# Patient Record
Sex: Male | Born: 1985 | Race: Black or African American | Hispanic: No | Marital: Married | Smoking: Current every day smoker
Health system: Southern US, Community
[De-identification: ages and names within clinical notes are randomized; demographics above are authoritative.]

## PROBLEM LIST (undated history)

## (undated) DIAGNOSIS — R0602 Shortness of breath: Secondary | ICD-10-CM

## (undated) DIAGNOSIS — F419 Anxiety disorder, unspecified: Secondary | ICD-10-CM

## (undated) DIAGNOSIS — F4310 Post-traumatic stress disorder, unspecified: Secondary | ICD-10-CM

## (undated) HISTORY — DX: Anxiety disorder, unspecified: F41.9

## (undated) HISTORY — DX: Post-traumatic stress disorder, unspecified: F43.10

## (undated) HISTORY — DX: Shortness of breath: R06.02

## (undated) HISTORY — PX: NO PAST SURGERIES: SHX2092

---

## 2015-08-17 ENCOUNTER — Encounter (HOSPITAL_COMMUNITY): Payer: Self-pay | Admitting: *Deleted

## 2015-08-17 ENCOUNTER — Emergency Department (HOSPITAL_COMMUNITY)
Admission: EM | Admit: 2015-08-17 | Discharge: 2015-08-17 | Disposition: A | Discharge status: Home / Self Care | Payer: Self-pay | Attending: Emergency Medicine | Admitting: Emergency Medicine

## 2015-08-17 ENCOUNTER — Emergency Department (HOSPITAL_COMMUNITY): Payer: Self-pay

## 2015-08-17 DIAGNOSIS — J45909 Unspecified asthma, uncomplicated: Secondary | ICD-10-CM | POA: Insufficient documentation

## 2015-08-17 DIAGNOSIS — F419 Anxiety disorder, unspecified: Secondary | ICD-10-CM

## 2015-08-17 DIAGNOSIS — R0602 Shortness of breath: Secondary | ICD-10-CM

## 2015-08-17 DIAGNOSIS — R202 Paresthesia of skin: Secondary | ICD-10-CM | POA: Insufficient documentation

## 2015-08-17 DIAGNOSIS — Z880 Allergy status to penicillin: Secondary | ICD-10-CM | POA: Insufficient documentation

## 2015-08-17 DIAGNOSIS — F17210 Nicotine dependence, cigarettes, uncomplicated: Secondary | ICD-10-CM | POA: Insufficient documentation

## 2015-08-17 DIAGNOSIS — R42 Dizziness and giddiness: Secondary | ICD-10-CM | POA: Insufficient documentation

## 2015-08-17 LAB — BASIC METABOLIC PANEL
ANION GAP: 12 (ref 5–15)
BUN: 6 mg/dL (ref 6–20)
CALCIUM: 9.6 mg/dL (ref 8.9–10.3)
CO2: 23 mmol/L (ref 22–32)
CREATININE: 0.79 mg/dL (ref 0.61–1.24)
Chloride: 105 mmol/L (ref 101–111)
GFR calc Af Amer: >60 mL/min (ref >60)
GLUCOSE: 94 mg/dL (ref 65–99)
Potassium: 3 mmol/L — ABNORMAL LOW (ref 3.5–5.1)
Sodium: 140 mmol/L (ref 135–145)

## 2015-08-17 LAB — CBC WITH DIFFERENTIAL/PLATELET
BASOS ABS: 0 10*3/uL (ref 0.0–0.1)
Basophils Relative: 0 %
EOS ABS: 0.2 10*3/uL (ref 0.0–0.7)
EOS PCT: 2 %
HCT: 37.9 % — ABNORMAL LOW (ref 39.0–52.0)
Hemoglobin: 13.2 g/dL (ref 13.0–17.0)
LYMPHS PCT: 40 %
Lymphs Abs: 3.2 10*3/uL (ref 0.7–4.0)
MCH: 30.3 pg (ref 26.0–34.0)
MCHC: 34.8 g/dL (ref 30.0–36.0)
MCV: 87.1 fL (ref 78.0–100.0)
MONO ABS: 0.4 10*3/uL (ref 0.1–1.0)
Monocytes Relative: 5 %
Neutro Abs: 4.3 10*3/uL (ref 1.7–7.7)
Neutrophils Relative %: 53 %
PLATELETS: 197 10*3/uL (ref 150–400)
RBC: 4.35 MIL/uL (ref 4.22–5.81)
RDW: 11.8 % (ref 11.5–15.5)
WBC: 8.2 10*3/uL (ref 4.0–10.5)

## 2015-08-17 LAB — I-STAT TROPONIN, ED: TROPONIN I, POC: 0 ng/mL (ref 0.00–0.08)

## 2015-08-17 LAB — D-DIMER, QUANTITATIVE (NOT AT ARMC)

## 2015-08-17 MED ORDER — LORAZEPAM 1 MG PO TABS
1.0000 mg | ORAL_TABLET | Freq: Once | ORAL | Status: AC
  Administered 2015-08-17: 1 mg via ORAL
  Filled 2015-08-17: qty 1

## 2015-08-17 NOTE — ED Notes (Signed)
Pt states he had a panic attack earlier which caused him to have shortness of breath. He went to urgent care and did an EKG, prescribed pt xanax and citalopram. States they did not fill the scripts. Pt states he still feels chest pain and lightheadedness.

## 2015-08-17 NOTE — ED Notes (Signed)
Patient verbalized understanding of discharge instructions and denies any further needs or questions at this time. VS stable. Patient ambulatory with steady gait.  

## 2015-08-17 NOTE — ED Notes (Signed)
Patient transported to x-ray. ?

## 2015-08-17 NOTE — ED Provider Notes (Signed)
CSN: [id number]     Arrival date & time 08/17/15  0132 History  By signing my name below, I, Doreatha Martin, attest that this documentation has been prepared under the direction and in the presence of Shon Baton, MD. Electronically Signed: Doreatha Martin, ED Scribe. 08/17/2015. 2:16 AM.    Chief Complaint  Patient presents with  . Shortness of Breath    The history is provided by the patient. No language interpreter was used.   HPI Comments: Darrell Riley is a 30 y.o. male otherwise healthy who presents to the Emergency Department complaining of moderate, exertional SOB onset while sitting at work at 5 PM with associated orthostatic dizziness, chest "warmth", paresthesias to bilateral extremities. He denies recent increased stress. Pt was seen at [facility] for the same symptoms earlier today and received rx for Xanax and Citalopram, which he did not have filled. Pt states he had EKG with no acute findings at urgent care but does report that "my HR was fast." He states that he went home and was feeling well when he began to feel SOB again. He states h/o one episode of similar symptoms, but not as severe. Pt attributes these symptoms to a panic attack, but states he has not been formally diagnosed or worked up. No recent hospitalization, long travel, recent surgery, h/o asthma. He denies recent cough, leg swelling, fever, congestion.   History reviewed. No pertinent past medical history. History reviewed. No pertinent past surgical history. No family history on file. Social History  Substance Use Topics  . Smoking status: Current Every Day Smoker -- 0.50 packs/day    Types: Cigarettes  . Smokeless tobacco: None  . Alcohol Use: Yes     Comment: occ    Review of Systems  Constitutional: Negative for fever.  HENT: Negative for congestion.   Respiratory: Positive for shortness of breath. Negative for cough.   Cardiovascular: Negative for leg swelling.  Neurological: Positive for dizziness.  All  other systems reviewed and are negative.  Allergies  Amoxicillin  Home Medications   Prior to Admission medications   Not on File   BP 113/74 mmHg  Pulse 61  Temp(Src) 97.7 F (36.5 C) (Oral)  Resp 14  SpO2 99% Physical Exam  Constitutional: He is oriented to person, place, and time. He appears well-developed and well-nourished. No distress.  HENT:  Head: Normocephalic and atraumatic.  Eyes: Pupils are equal, round, and reactive to light.  Cardiovascular: Normal rate, regular rhythm and normal heart sounds.   No murmur heard. Pulmonary/Chest: Effort normal and breath sounds normal. No respiratory distress. He has no wheezes.  Abdominal: Soft. There is no tenderness.  Musculoskeletal: He exhibits no edema.  Neurological: He is alert and oriented to person, place, and time.  Skin: Skin is warm and dry.  Psychiatric: He has a normal mood and affect.  Nursing note and vitals reviewed.   ED Course  Procedures (including critical care time) DIAGNOSTIC STUDIES: Oxygen Saturation is 100% on RA, normal by my interpretation.    COORDINATION OF CARE: 2:15 AM Discussed treatment plan with pt at bedside and pt agreed to plan.   Labs Review Labs Reviewed  CBC WITH DIFFERENTIAL/PLATELET - Abnormal; Notable for the following:    HCT 37.9 (*)    All other components within normal limits  BASIC METABOLIC PANEL - Abnormal; Notable for the following:    Potassium 3.0 (*)    All other components within normal limits  D-DIMER, QUANTITATIVE (NOT AT [facility])  I-STAT  TROPOININ, ED    Imaging Review Dg Chest 2 View  08/17/2015  CLINICAL DATA:  Acute onset of shortness of breath and anxiety. Initial encounter. EXAM: CHEST  2 VIEW COMPARISON:  None. FINDINGS: The lungs are well-aerated and clear. There is no evidence of focal opacification, pleural effusion or pneumothorax. The heart is normal in size; the mediastinal contour is within normal limits. No acute osseous abnormalities are seen.  IMPRESSION: No acute cardiopulmonary process seen. Electronically Signed   By: Roanna Raider M.D.   On: 08/17/2015 05:49   I have personally reviewed and evaluated these images and lab results as part of my medical decision-making.   EKG Interpretation   Date/Time:  Saturday August 17 2015 01:47:27 EST Ventricular Rate:  61 PR Interval:  150 QRS Duration: 94 QT Interval:  416 QTC Calculation: 418 R Axis:   68 Text Interpretation:  Normal sinus rhythm Minimal voltage criteria for  LVH, may be normal variant Borderline ECG NO prior for comparison  Confirmed by HORTON  MD, COURTNEY ([id number]) on 08/17/2015 1:57:56 AM      MDM   Final diagnoses:  SOB (shortness of breath)  Anxiety    Patient presents with shortness of breath. He relates this to anxiety. He is in no acute distress. Vital signs are reassuring. No recent upper respiratory symptoms or fever. Reports being seen at urgent care and prescribed Xanax. He is not taking any medications. No risk factors for ACS or PE. No evidence of arrhythmia on EKG.  Workup including troponin, d-dimer, chest x-ray all reassuring. Basic labwork obtained and shows mild hypokalemia but otherwise reassuring. Discussed the results with the patient. Given reported "high heart rate" will have patient follow-up with cardiology for evaluation and possible Holter monitoring.  After history, exam, and medical workup I feel the patient has been appropriately medically screened and is safe for discharge home. Pertinent diagnoses were discussed with the patient. Patient was given return precautions.  I personally performed the services described in this documentation, which was scribed in my presence. The recorded information has been reviewed and is accurate.   Shon Baton, MD 08/17/15 [phone]

## 2015-08-17 NOTE — Discharge Instructions (Signed)
Panic Attacks °Panic attacks are sudden, short-lived surges of severe anxiety, fear, or discomfort. They may occur for no reason when you are relaxed, when you are anxious, or when you are sleeping. Panic attacks may occur for a number of reasons:  °· Healthy people occasionally have panic attacks in extreme, life-threatening situations, such as war or natural disasters. Normal anxiety is a protective mechanism of the body that helps us react to danger (fight or flight response). °· Panic attacks are often seen with anxiety disorders, such as panic disorder, social anxiety disorder, generalized anxiety disorder, and phobias. Anxiety disorders cause excessive or uncontrollable anxiety. They may interfere with your relationships or other life activities. °· Panic attacks are sometimes seen with other mental illnesses, such as depression and posttraumatic stress disorder. °· Certain medical conditions, prescription medicines, and drugs of abuse can cause panic attacks. °SYMPTOMS  °Panic attacks start suddenly, peak within 20 minutes, and are accompanied by four or more of the following symptoms: °· Pounding heart or fast heart rate (palpitations). °· Sweating. °· Trembling or shaking. °· Shortness of breath or feeling smothered. °· Feeling choked. °· Chest pain or discomfort. °· Nausea or strange feeling in your stomach. °· Dizziness, light-headedness, or feeling like you will faint. °· Chills or hot flushes. °· Numbness or tingling in your lips or hands and feet. °· Feeling that things are not real or feeling that you are not yourself. °· Fear of losing control or going crazy. °· Fear of dying. °Some of these symptoms can mimic serious medical conditions. For example, you may think you are having a heart attack. Although panic attacks can be very scary, they are not life threatening. °DIAGNOSIS  °Panic attacks are diagnosed through an assessment by your health care provider. Your health care provider will ask  questions about your symptoms, such as where and when they occurred. Your health care provider will also ask about your medical history and use of alcohol and drugs, including prescription medicines. Your health care provider may order blood tests or other studies to rule out a serious medical condition. Your health care provider may refer you to a mental health professional for further evaluation. °TREATMENT  °· Most healthy people who have one or two panic attacks in an extreme, life-threatening situation will not require treatment. °· The treatment for panic attacks associated with anxiety disorders or other mental illness typically involves counseling with a mental health professional, medicine, or a combination of both. Your health care provider will help determine what treatment is best for you. °· Panic attacks due to physical illness usually go away with treatment of the illness. If prescription medicine is causing panic attacks, talk with your health care provider about stopping the medicine, decreasing the dose, or substituting another medicine. °· Panic attacks due to alcohol or drug abuse go away with abstinence. Some adults need professional help in order to stop drinking or using drugs. °HOME CARE INSTRUCTIONS  °· Take all medicines as directed by your health care provider.   °· Schedule and attend follow-up visits as directed by your health care provider. It is important to keep all your appointments. °SEEK MEDICAL CARE IF: °· You are not able to take your medicines as prescribed. °· Your symptoms do not improve or get worse. °SEEK IMMEDIATE MEDICAL CARE IF:  °· You experience panic attack symptoms that are different than your usual symptoms. °· You have serious thoughts about hurting yourself or others. °· You are taking medicine for panic attacks and   have a serious side effect. °MAKE SURE YOU: °· Understand these instructions. °· Will watch your condition. °· Will get help right away if you are not  doing well or get worse. °  °This information is not intended to replace advice given to you by your health care provider. Make sure you discuss any questions you have with your health care provider. °  °Document Released: 06/08/2005 Document Revised: 06/13/2013 Document Reviewed: 01/20/2013 °Elsevier Interactive Patient Education ©2016 Elsevier Inc[employer] ° °Shortness of Breath °Shortness of breath means you have trouble breathing. It could also mean that you have a medical problem. You should get immediate medical care for shortness of breath. °CAUSES  °· Not enough oxygen in the air such as with high altitudes or a smoke-filled room. °· Certain lung diseases, infections, or problems. °· Heart disease or conditions, such as angina or heart failure. °· Low red blood cells (anemia). °· Poor physical fitness, which can cause shortness of breath when you exercise. °· Chest or back injuries or stiffness. °· Being overweight. °· Smoking. °· Anxiety, which can make you feel like you are not getting enough air. °DIAGNOSIS  °Serious medical problems can often be found during your physical exam. Tests may also be done to determine why you are having shortness of breath. Tests may include: °· Chest X-rays. °· Lung function tests. °· Blood tests. °· An electrocardiogram (ECG). °· An ambulatory electrocardiogram. An ambulatory ECG records your heartbeat patterns over a 24-hour period. °· Exercise testing. °· A transthoracic echocardiogram (TTE). During echocardiography, sound waves are used to evaluate how blood flows through your heart. °· A transesophageal echocardiogram (TEE). °· Imaging scans. °Your health care provider may not be able to find a cause for your shortness of breath after your exam. In this case, it is important to have a follow-up exam with your health care provider as directed.  °TREATMENT  °Treatment for shortness of breath depends on the cause of your symptoms and can vary greatly. °HOME CARE INSTRUCTIONS   °· Do not smoke. Smoking is a common cause of shortness of breath. If you smoke, ask for help to quit. °· Avoid being around chemicals or things that may bother your breathing, such as paint fumes and dust. °· Rest as needed. Slowly resume your usual activities. °· If medicines were prescribed, take them as directed for the full length of time directed. This includes oxygen and any inhaled medicines. °· Keep all follow-up appointments as directed by your health care provider. °SEEK MEDICAL CARE IF:  °· Your condition does not improve in the time expected. °· You have a hard time doing your normal activities even with rest. °· You have any new symptoms. °SEEK IMMEDIATE MEDICAL CARE IF:  °· Your shortness of breath gets worse. °· You feel light-headed, faint, or develop a cough not controlled with medicines. °· You start coughing up blood. °· You have pain with breathing. °· You have chest pain or pain in your arms, shoulders, or abdomen. °· You have a fever. °· You are unable to walk up stairs or exercise the way you normally do. °MAKE SURE YOU: °· Understand these instructions. °· Will watch your condition. °· Will get help right away if you are not doing well or get worse. °  °This information is not intended to replace advice given to you by your health care provider. Make sure you discuss any questions you have with your health care provider. °  °Document Released: 03/03/2001 Document Revised: 06/13/2013   Document Reviewed: 08/24/2011 °Elsevier Interactive Patient Education ©2016 Elsevier Inc. ° °

## 2015-08-26 NOTE — Progress Notes (Signed)
  Cardiology Office Note:    Date:  08/26/2015   ID:  Darrell Riley, DOB 12-20-1985, MRN [id number]  PCP:  No PCP Per Patient  Cardiologist:  New -   Electrophysiologist:  n/a  Chief Complaint  Patient presents with  . Hospitalization Follow-up    ED visit 08/17/15 for Dyspnea    History of Present Illness:     Darrell Siasaron Zerkle is a 30 y.o. male with a hx of     Past Medical History  Diagnosis Date  . SOB (shortness of breath)   . Anxiety     Past Surgical History  Procedure Laterality Date  . No past surgeries      Current Medications: No outpatient prescriptions prior to visit.   No facility-administered medications prior to visit.     Allergies:   Amoxicillin   Social History   Social History  . Marital Status: Married    Spouse Name: N/A  . Number of Children: N/A  . Years of Education: N/A   Social History Main Topics  . Smoking status: Current Every Day Smoker -- 0.50 packs/day    Types: Cigarettes  . Smokeless tobacco: Not on file  . Alcohol Use: Yes     Comment: occ  . Drug Use: No  . Sexual Activity: Not on file   Other Topics Concern  . Not on file   Social History Narrative     Family History:  The patient's family history is not on file.   ROS:   Please see the history of present illness.    ROS All other systems reviewed and are negative.   Physical Exam:    VS:  There were no vitals taken for this visit.   GEN: Well nourished, well developed, in no acute distress HEENT: normal Neck: no JVD, no masses Cardiac: Normal S1/S2, RRR; no murmurs, rubs, or gallops, no edema;   carotid bruits,   Respiratory:  clear to auscultation bilaterally; no wheezing, rhonchi or rales GI: soft, nontender, nondistended, + BS MS: no deformity or atrophy Skin: warm and dry, no rash Neuro:  Bilateral strength equal, no focal deficits  Psych: Alert and oriented x 3, normal affect  Wt Readings from Last 3 Encounters:  No data found for Wt       Studies/Labs Reviewed:     EKG:  EKG is  ordered today.  The ekg ordered today demonstrates   Recent Labs: 08/17/2015: BUN 6; Creatinine, Ser 0.79; Hemoglobin 13.2; Platelets 197; Potassium 3.0*; Sodium 140   Recent Lipid Panel No results found for: CHOL, TRIG, HDL, CHOLHDL, VLDL, LDLCALC, LDLDIRECT  Additional studies/ records that were reviewed today include:      ASSESSMENT:     No diagnosis found.  PLAN:     In order of problems listed above:  1.    Medication Adjustments/Labs and Tests Ordered: Current medicines are reviewed at length with the patient today.  Concerns regarding medicines are outlined above.  Medication changes, Labs and Tests ordered today are outlined in the Patient Instructions noted below. There are no Patient Instructions on file for this visit. Signed, Tereso NewcomerScott Heath Badon, PA-C  08/26/2015 10:41 PM    [facility] [address], [place], [place]  [address] Phone: [phone]; Fax: [phone]     This encounter was created in error - please disregard.

## 2015-08-27 ENCOUNTER — Encounter: Payer: Self-pay | Admitting: Physician Assistant

## 2016-06-01 ENCOUNTER — Encounter: Payer: Self-pay | Admitting: Cardiology

## 2016-06-01 ENCOUNTER — Ambulatory Visit (INDEPENDENT_AMBULATORY_CARE_PROVIDER_SITE_OTHER): Payer: 59 | Admitting: Cardiology

## 2016-06-01 VITALS — BP 128/87 | HR 58 | Ht 69.0 in | Wt 132.0 lb

## 2016-06-01 DIAGNOSIS — R0602 Shortness of breath: Secondary | ICD-10-CM | POA: Diagnosis not present

## 2016-06-01 DIAGNOSIS — R002 Palpitations: Secondary | ICD-10-CM | POA: Diagnosis not present

## 2016-06-01 DIAGNOSIS — F419 Anxiety disorder, unspecified: Secondary | ICD-10-CM

## 2016-06-01 NOTE — Patient Instructions (Signed)
Medication Instructions: Your physician recommends that you continue on your current medications as directed. Please refer to the Current Medication list given to you today.   Labwork:  Your physician recommends that you return for lab work today: CBC, BMP, T4,free, TSH, Magnesium  Testing/Procedures: Your physician has recommended that you wear a 48 hr holter monitor. Holter monitors are medical devices that record the heart's electrical activity. Doctors most often use these monitors to diagnose arrhythmias. Arrhythmias are problems with the speed or rhythm of the heartbeat. The monitor is a small, portable device. You can wear one while you do your normal daily activities. This is usually used to diagnose what is causing palpitations/syncope (passing out).  Your physician has requested that you have an echocardiogram. Echocardiography is a painless test that uses sound waves to create images of your heart. It provides your doctor with information about the size and shape of your heart and how well your heart's chambers and valves are working. This procedure takes approximately one hour. There are no restrictions for this procedure.  *Both will be done at [address]*   Follow-Up: Your physician recommends that you schedule a follow-up appointment in: 4-6 weeks with Dr. SwazilandJordan.  If you need a refill on your cardiac medications before your next appointment, please call your pharmacy.

## 2016-06-01 NOTE — Progress Notes (Addendum)
Cardiology Office Note  NEW PATIENT VISIT   Date:  06/01/2016   ID:  Darrell Riley, DOB 12/15/1985, MRN [id number]  PCP:  No PCP Per Patient  Cardiologist:  New Dr. SwazilandJordan     Chief Complaint  Patient presents with  . Hospitalization Follow-up    pt states some tightness in his chest during his panic attacks has also been experiencing dizziness during his panic attacks   . Shortness of Breath    sometimes when trying to go to sleep he feels like he is losing his breath and sometimes it wakes him up and he goes into a pnic attack   . New Patient (Initial Visit)      History of Present Illness: Darrell Riley is a 30 y.o. male who presents for post hospital in 07/2015.  He felt he was doing well but now with more tachycardia and panic attacks.    [facility] with SOB and anxiety.  He had complained of high heart rate.  EKG with LVH though may be normal varrient.   Today he presents with more freq episodes.  They do begin as dizziness, SOB  This may awaken from sleep and he has to sit up.  If not controlled it becomes full panic attack.  He denies any but rare caffeine and no energy drinks.  He was on meds for his anxiety but when he had no more symptoms he stopped using and really did not have a problem for months.  He is now having episodes daily.  He does smoke and I have asked him to stop but with panic attacks this may be difficult.    In Feb his CXR was normal and DDimer was low.  His K+ was also low at 3.0.    No FH of heart disease both parents with HTN.      Past Medical History:  Diagnosis Date  . Anxiety   . SOB (shortness of breath)     Past Surgical History:  Procedure Laterality Date  . NO PAST SURGERIES       No current outpatient prescriptions on file.   No current facility-administered medications for this visit.     Allergies:   Amoxicillin    Social History:  The patient  reports that he has been smoking Cigarettes.  He has been  smoking about 0.50 packs per day. He has never used smokeless tobacco. He reports that he drinks alcohol. He reports that he does not use drugs.   Family History:  The patient's family history includes Hypertension in his father and mother.    ROS:  General:no colds or fevers, no weight changes Skin:no rashes or ulcers HEENT:no blurred vision, no congestion CV:see HPI PUL:see HPI GI:no diarrhea constipation or melena, no indigestion GU:no hematuria, no dysuria MS:no joint pain, no claudication Neuro:no syncope, no lightheadedness Endo:no diabetes, no thyroid disease  Wt Readings from Last 3 Encounters:  06/01/16 132 lb (59.9 kg)     PHYSICAL EXAM: VS:  BP 128/87   Pulse (!) 58   Ht 5\' 9"  (1.753 m)   Wt 132 lb (59.9 kg)   BMI 19.49 kg/m  , BMI Body mass index is 19.49 kg/m. General:Pleasant affect, NAD Skin:Warm and dry, brisk capillary refill HEENT:normocephalic, sclera clear, mucus membranes moist Neck:supple, no JVD, no bruits  Heart:S1S2 RRR without murmur, gallup, rub or click Lungs:clear without rales, rhonchi, or wheezes UUV:OZDGAbd:soft, non tender, + BS, do not palpate liver spleen  or masses Ext:no lower ext edema, 2+ pedal pulses, 2+ radial pulses Neuro:alert and oriented, MAE, follows commands, + facial symmetry    EKG:  EKG is ordered today. The ekg ordered today demonstrates Sinus Brady at 58, no acute changes from Feb.     Recent Labs: 08/17/2015: BUN 6; Creatinine, Ser 0.79; Hemoglobin 13.2; Platelets 197; Potassium 3.0; Sodium 140    Lipid Panel No results found for: CHOL, TRIG, HDL, CHOLHDL, VLDL, LDLCALC, LDLDIRECT     Other studies Reviewed: Additional studies/ records that were reviewed today include: ER notes labs and XRAYS.Marland Kitchen.   ASSESSMENT AND PLAN:  1. Palpitations with dizziness and SOB occurs before panic attacks.occurring daily will check 48 hour holter to eval.  Also Echo with palpitations and dyspnea.  Also check BMP, CBC , TSH, Free T4 and  mg+ level. Discussed with Dr. SwazilandJordan.    2.  SOB at rest.  Normal CXR he does smoke asked him to stop will check echo  3. Panic attacks- ok to see primary for treatment.  He will follow up with Dr. SwazilandJordan in 4-6 weeks though we will call him results of tests.    His wife was with him today for exam.   Current medicines are reviewed with the patient today.  The patient Has no concerns regarding medicines.  The following changes have been made:  See above Labs/ tests ordered today include:see above  Disposition:   FU:  see above  Signed, Nada BoozerLaura Ingold, NP  06/01/2016 10:43 AM    [facility] [address], [place], [place]  [address]/ [address] [place], [place] Phone: [phone](336) [phone]; Fax: [phone]  [phone]

## 2016-06-02 LAB — CBC WITH DIFFERENTIAL/PLATELET
Basophils Absolute: 0 10*3/uL (ref 0.0–0.2)
Basos: 0 %
EOS (ABSOLUTE): 0.2 10*3/uL (ref 0.0–0.4)
Eos: 3 %
Hematocrit: 41.7 % (ref 37.5–51.0)
Hemoglobin: 14.3 g/dL (ref 13.0–17.7)
Immature Grans (Abs): 0 10*3/uL (ref 0.0–0.1)
Immature Granulocytes: 0 %
Lymphocytes Absolute: 2.2 10*3/uL (ref 0.7–3.1)
Lymphs: 30 %
MCH: 31.1 pg (ref 26.6–33.0)
MCHC: 34.3 g/dL (ref 31.5–35.7)
MCV: 91 fL (ref 79–97)
Monocytes Absolute: 0.6 10*3/uL (ref 0.1–0.9)
Monocytes: 9 %
Neutrophils Absolute: 4.2 10*3/uL (ref 1.4–7.0)
Neutrophils: 58 %
Platelets: 223 10*3/uL (ref 150–379)
RBC: 4.6 x10E6/uL (ref 4.14–5.80)
RDW: 12.8 % (ref 12.3–15.4)
WBC: 7.2 10*3/uL (ref 3.4–10.8)

## 2016-06-02 LAB — BASIC METABOLIC PANEL
BUN/Creatinine Ratio: 8 — ABNORMAL LOW (ref 9–20)
BUN: 7 mg/dL (ref 6–20)
CO2: 23 mmol/L (ref 18–29)
Calcium: 9.3 mg/dL (ref 8.7–10.2)
Chloride: 102 mmol/L (ref 96–106)
Creatinine, Ser: 0.83 mg/dL (ref 0.76–1.27)
GFR calc Af Amer: 138 mL/min/{1.73_m2} (ref >59)
GFR calc non Af Amer: 120 mL/min/{1.73_m2} (ref >59)
Glucose: 88 mg/dL (ref 65–99)
Potassium: 3.6 mmol/L (ref 3.5–5.2)
Sodium: 142 mmol/L (ref 134–144)

## 2016-06-02 LAB — MAGNESIUM: Magnesium: 2 mg/dL (ref 1.6–2.3)

## 2016-06-02 LAB — T4, FREE: FREE T4: 1.15 ng/dL (ref 0.82–1.77)

## 2016-06-02 LAB — TSH: TSH: 2.13 u[IU]/mL (ref 0.450–4.500)

## 2016-06-08 ENCOUNTER — Encounter: Payer: Self-pay | Admitting: *Deleted

## 2016-06-08 NOTE — Progress Notes (Signed)
Patient ID: Darrell Riley, male   DOB: 31-Mar-1986, 30 y.o.   MRN: [id number]  Patient did not show up for 06/08/16, 12:00 PM, appointment to have a cardiac event monitor applied.

## 2016-06-08 NOTE — Progress Notes (Signed)
Patient ID: Darrell Riley, male   DOB: 1985-10-05, 30 y.o.   MRN: [id number]  Patient did not show up for 06/08/16, 2:30 PM, appointment to have a 48 hour holter monitor applied.

## 2016-06-10 ENCOUNTER — Other Ambulatory Visit (HOSPITAL_COMMUNITY): Payer: 59 | Attending: Psychiatry | Admitting: Psychiatry

## 2016-06-10 ENCOUNTER — Encounter (HOSPITAL_COMMUNITY): Payer: Self-pay

## 2016-06-10 DIAGNOSIS — F4310 Post-traumatic stress disorder, unspecified: Secondary | ICD-10-CM | POA: Insufficient documentation

## 2016-06-10 DIAGNOSIS — F4001 Agoraphobia with panic disorder: Secondary | ICD-10-CM | POA: Insufficient documentation

## 2016-06-10 MED ORDER — ALPRAZOLAM 0.25 MG PO TABS: 0.2500 mg | ORAL_TABLET | Freq: Three times a day (TID) | ORAL | 0 refills | Status: AC | PRN
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
## 2016-06-10 NOTE — Progress Notes (Signed)
    Daily Group Progress Note  Program: IOP  Group Time: 9:00-12:00  Participation Level: Active  Behavioral Response: Appropriate  Type of Therapy:  Group Therapy  Summary of Progress: Pt. Met with case manager and psychiatrist for most of the group. Pt. Introduced himself to the group and was alert and attentive during discussion about developing healthy personal boundaries and assertiveness. Pt. Shared that he was challenged by anxiety and panic attacks.     Nancie Neas, LPC
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
## 2016-06-11 ENCOUNTER — Other Ambulatory Visit (HOSPITAL_COMMUNITY): Payer: 59

## 2016-06-12 ENCOUNTER — Other Ambulatory Visit (HOSPITAL_COMMUNITY): Payer: 59

## 2016-06-16 ENCOUNTER — Other Ambulatory Visit (HOSPITAL_COMMUNITY): Payer: 59

## 2016-06-16 ENCOUNTER — Ambulatory Visit (INDEPENDENT_AMBULATORY_CARE_PROVIDER_SITE_OTHER): Payer: 59

## 2016-06-16 DIAGNOSIS — R002 Palpitations: Secondary | ICD-10-CM

## 2016-06-16 DIAGNOSIS — R0602 Shortness of breath: Secondary | ICD-10-CM

## 2016-06-17 ENCOUNTER — Other Ambulatory Visit (HOSPITAL_COMMUNITY): Payer: 59

## 2016-06-18 ENCOUNTER — Other Ambulatory Visit (HOSPITAL_COMMUNITY): Payer: 59

## 2016-06-19 ENCOUNTER — Other Ambulatory Visit (HOSPITAL_COMMUNITY): Payer: 59 | Admitting: Psychiatry
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
## 2016-06-23 ENCOUNTER — Other Ambulatory Visit (HOSPITAL_COMMUNITY): Payer: Self-pay

## 2016-06-24 ENCOUNTER — Other Ambulatory Visit (HOSPITAL_COMMUNITY): Payer: 59 | Admitting: Psychiatry

## 2016-06-24 DIAGNOSIS — F410 Panic disorder [episodic paroxysmal anxiety] without agoraphobia: Secondary | ICD-10-CM | POA: Insufficient documentation

## 2016-06-24 NOTE — Progress Notes (Signed)
BH IOP DISCHARGE NOTE  Patient:  [name] DOB:  1986-02-23  Date of Admission: 06/10/2016  Date of Discharge: 06/24/2016  Reason for Admission:jpanic attacks and PTSD symptoms   IOP Course:attended one day.  Was given a prescription for alprazolam and did not return after one group   Mental Status at Discharge:no change though he was optimistic that he could handle things with medication as needed as opposed to learning techniques  Diagnosis: panic disorder  Level of Care:  IOP  Discharge destination:follow up with his EAP from work    Comments:  Clifton Custardaron was reluctant to commit to IOP.  He seemed to be able to handle things with the help of alprazolam as needed so we expected he might not follow through.  He was given a prescription for alprazolam 0.25 mg tid prn # 30 with no refillls  The patient received suicide prevention pamphlet:  Yes   Carolanne GrumblingGerald Taylor, MD

## 2016-06-25 ENCOUNTER — Other Ambulatory Visit (HOSPITAL_COMMUNITY): Payer: 59

## 2016-06-25 NOTE — Progress Notes (Deleted)
Cardiology Office Note  NEW PATIENT VISIT   Date:  06/25/2016   ID:  Darrell Riley, DOB October 10, 1985, MRN [id number]  PCP:  No PCP Per Patient  Cardiologist:  New Dr. [place]     No chief complaint on file.     History of Present Illness: Darrell Riley is a 31 y.o. male who is seen for follow up palpitations.    He was seen in [facility] ER 07/2015 with SOB and anxiety.  He had complained of high heart rate.  EKG with LVH though may be normal varrient.   He was seen in December with  more freq episodes.  They do begin as dizziness, SOB  This may awaken from sleep and he has to sit up.  If not controlled it becomes full panic attack.  He denies any but rare caffeine and no energy drinks.  He was on meds for his anxiety but when he had no more symptoms he stopped using and really did not have a problem for months.  He is now having episodes daily.  He does smoke and I have asked him to stop but with panic attacks this may be difficult.    In Feb his CXR was normal and DDimer was low.  His K+ was also low at 3.0.    No FH of heart disease both parents with HTN.      Past Medical History:  Diagnosis Date  . Anxiety   . PTSD (post-traumatic stress disorder)   . SOB (shortness of breath)     Past Surgical History:  Procedure Laterality Date  . NO PAST SURGERIES       Current Outpatient Prescriptions  Medication Sig Dispense Refill  . ALPRAZolam (XANAX) 0.25 MG tablet Take 1 tablet (0.25 mg total) by mouth 3 (three) times daily as needed for anxiety. 30 tablet 0   No current facility-administered medications for this visit.     Allergies:   Amoxicillin    Social History:  The patient  reports that he has been smoking Cigarettes.  He has been smoking about 0.50 packs per day. He has never used smokeless tobacco. He reports that he drinks alcohol. He reports that he does not use drugs.   Family History:  The patient's family history includes Anxiety disorder in his mother;  Hypertension in his father and mother.    ROS:  General:no colds or fevers, no weight changes Skin:no rashes or ulcers HEENT:no blurred vision, no congestion CV:see HPI PUL:see HPI GI:no diarrhea constipation or melena, no indigestion GU:no hematuria, no dysuria MS:no joint pain, no claudication Neuro:no syncope, no lightheadedness Endo:no diabetes, no thyroid disease  Wt Readings from Last 3 Encounters:  06/01/16 132 lb (59.9 kg)     PHYSICAL EXAM: VS:  There were no vitals taken for this visit. , BMI There is no height or weight on file to calculate BMI. General:Pleasant affect, NAD Skin:Warm and dry, brisk capillary refill HEENT:normocephalic, sclera clear, mucus membranes moist Neck:supple, no JVD, no bruits  Heart:S1S2 RRR without murmur, gallup, rub or click Lungs:clear without rales, rhonchi, or wheezes WUJ:WJXB, non tender, + BS, do not palpate liver spleen or masses Ext:no lower ext edema, 2+ pedal pulses, 2+ radial pulses Neuro:alert and oriented, MAE, follows commands, + facial symmetry    EKG:  EKG is ordered today. The ekg ordered today demonstrates Sinus Brady at 41, no acute changes from Feb.     Recent Labs: 08/17/2015: Hemoglobin 13.2 06/01/2016: BUN 7; Creatinine, Ser 0.83;  Magnesium 2.0; Platelets 223; Potassium 3.6; Sodium 142; TSH 2.130    Lipid Panel No results found for: CHOL, TRIG, HDL, CHOLHDL, VLDL, LDLCALC, LDLDIRECT   48 hour Holter monitor demonstrated only one PAC and 2 PVCs during recording.   Other studies Reviewed: Additional studies/ records that were reviewed today include: ER notes labs and XRAYS.Marland Kitchen.   ASSESSMENT AND PLAN:  1. Palpitations with dizziness and SOB occurs before panic attacks.occurring daily will check 48 hour holter to eval.  Also Echo with palpitations and dyspnea.  Also check BMP, CBC , TSH, Free T4 and mg+ level. Discussed with Dr. SwazilandJordan.    2.  SOB at rest.  Normal CXR he does smoke asked him to stop will check  echo  3. Panic attacks- ok to see primary for treatment.  He will follow up with Dr. SwazilandJordan in 4-6 weeks though we will call him results of tests.    His wife was with him today for exam.   Current medicines are reviewed with the patient today.  The patient Has no concerns regarding medicines.  The following changes have been made:  See above Labs/ tests ordered today include:see above  Disposition:   FU:  see above  Signed, Peter SwazilandJordan, MD,FACC 06/25/2016 4:42 PM    [facility] [address], [place], [place]  [address]/ [address] [place], [place] Phone: [phone]; Fax: [phone]  [phone]

## 2016-06-26 ENCOUNTER — Other Ambulatory Visit (HOSPITAL_COMMUNITY): Payer: 59

## 2016-06-29 ENCOUNTER — Other Ambulatory Visit (HOSPITAL_COMMUNITY): Payer: 59

## 2016-06-30 ENCOUNTER — Encounter: Payer: Self-pay | Admitting: *Deleted

## 2016-06-30 ENCOUNTER — Ambulatory Visit: Payer: Self-pay | Admitting: Cardiology

## 2016-06-30 ENCOUNTER — Other Ambulatory Visit (HOSPITAL_COMMUNITY): Payer: 59

## 2016-07-01 ENCOUNTER — Other Ambulatory Visit (HOSPITAL_COMMUNITY): Payer: 59

## 2016-07-02 ENCOUNTER — Other Ambulatory Visit (HOSPITAL_COMMUNITY): Payer: 59

## 2016-07-03 ENCOUNTER — Other Ambulatory Visit (HOSPITAL_COMMUNITY): Payer: 59

## 2016-07-06 ENCOUNTER — Other Ambulatory Visit (HOSPITAL_COMMUNITY): Payer: 59

## 2016-07-07 ENCOUNTER — Other Ambulatory Visit (HOSPITAL_COMMUNITY): Payer: 59

## 2016-07-08 ENCOUNTER — Other Ambulatory Visit (HOSPITAL_COMMUNITY): Payer: 59

## 2016-07-09 ENCOUNTER — Other Ambulatory Visit (HOSPITAL_COMMUNITY): Payer: 59

## 2016-07-10 ENCOUNTER — Other Ambulatory Visit (HOSPITAL_COMMUNITY): Payer: 59

## 2016-07-13 ENCOUNTER — Other Ambulatory Visit (HOSPITAL_COMMUNITY): Payer: 59

## 2016-07-14 ENCOUNTER — Other Ambulatory Visit (HOSPITAL_COMMUNITY): Payer: 59

## 2016-07-15 ENCOUNTER — Other Ambulatory Visit (HOSPITAL_COMMUNITY): Payer: 59

## 2016-07-16 ENCOUNTER — Other Ambulatory Visit (HOSPITAL_COMMUNITY): Payer: 59

## 2016-07-17 ENCOUNTER — Other Ambulatory Visit (HOSPITAL_COMMUNITY): Payer: 59

## 2016-07-20 ENCOUNTER — Other Ambulatory Visit (HOSPITAL_COMMUNITY): Payer: 59

## 2016-07-21 ENCOUNTER — Other Ambulatory Visit (HOSPITAL_COMMUNITY): Payer: 59

## 2016-07-22 ENCOUNTER — Other Ambulatory Visit (HOSPITAL_COMMUNITY): Payer: 59

## 2016-07-23 ENCOUNTER — Other Ambulatory Visit (HOSPITAL_COMMUNITY): Payer: 59

## 2016-07-24 ENCOUNTER — Other Ambulatory Visit (HOSPITAL_COMMUNITY): Payer: 59

## 2016-07-27 ENCOUNTER — Other Ambulatory Visit (HOSPITAL_COMMUNITY): Payer: 59

## 2016-07-28 ENCOUNTER — Other Ambulatory Visit (HOSPITAL_COMMUNITY): Payer: 59

## 2016-07-29 ENCOUNTER — Other Ambulatory Visit (HOSPITAL_COMMUNITY): Payer: 59

## 2016-07-30 ENCOUNTER — Other Ambulatory Visit (HOSPITAL_COMMUNITY): Payer: 59

## 2016-07-31 ENCOUNTER — Other Ambulatory Visit (HOSPITAL_COMMUNITY): Payer: 59

## 2016-08-03 ENCOUNTER — Other Ambulatory Visit (HOSPITAL_COMMUNITY): Payer: 59

## 2016-08-04 ENCOUNTER — Other Ambulatory Visit (HOSPITAL_COMMUNITY): Payer: 59

## 2016-10-07 ENCOUNTER — Telehealth (HOSPITAL_COMMUNITY): Payer: Self-pay | Admitting: Radiology

## 2016-10-07 NOTE — Telephone Encounter (Signed)
Left message to schedule echocardiogram.

## 2016-11-09 ENCOUNTER — Telehealth (HOSPITAL_COMMUNITY): Payer: Self-pay | Admitting: Cardiology

## 2016-11-09 NOTE — Telephone Encounter (Signed)
User: [name], [name] Date/time: 11/06/2016 2:38 PM  Comment: Called pt and lmsg for him to CB to r/s echo.   Context: [place] Schedule Orders/Appt Requests Outcome: Left Message  Phone number: [phone] Phone Type: Home Phone  Comm. type: Telephone Call type: Outgoing  Contact: Darrell Riley, Darrell Riley Relation to patient: Self  Letter:      User: [name], [name] Date/time: 10/07/2016 4:36 PM  Context: [place] Schedule Orders/Appt Requests Outcome: Left Message  Phone number: [phone] Phone Type: Home Phone  Comm. type: Telephone Call type: Outgoing  Contact: Darrell Riley, Darrell Riley Relation to patient: Self  Letter:       Pt no-showed on 06/23/16.

## 2017-02-26 IMAGING — CR DG CHEST 2V
2 series · 2 of 2 positions shown · non-contrast
Comparison: None.

CLINICAL DATA: Acute onset of shortness of breath and anxiety.
Initial encounter.

EXAM:
CHEST  2 VIEW

[chest pa]
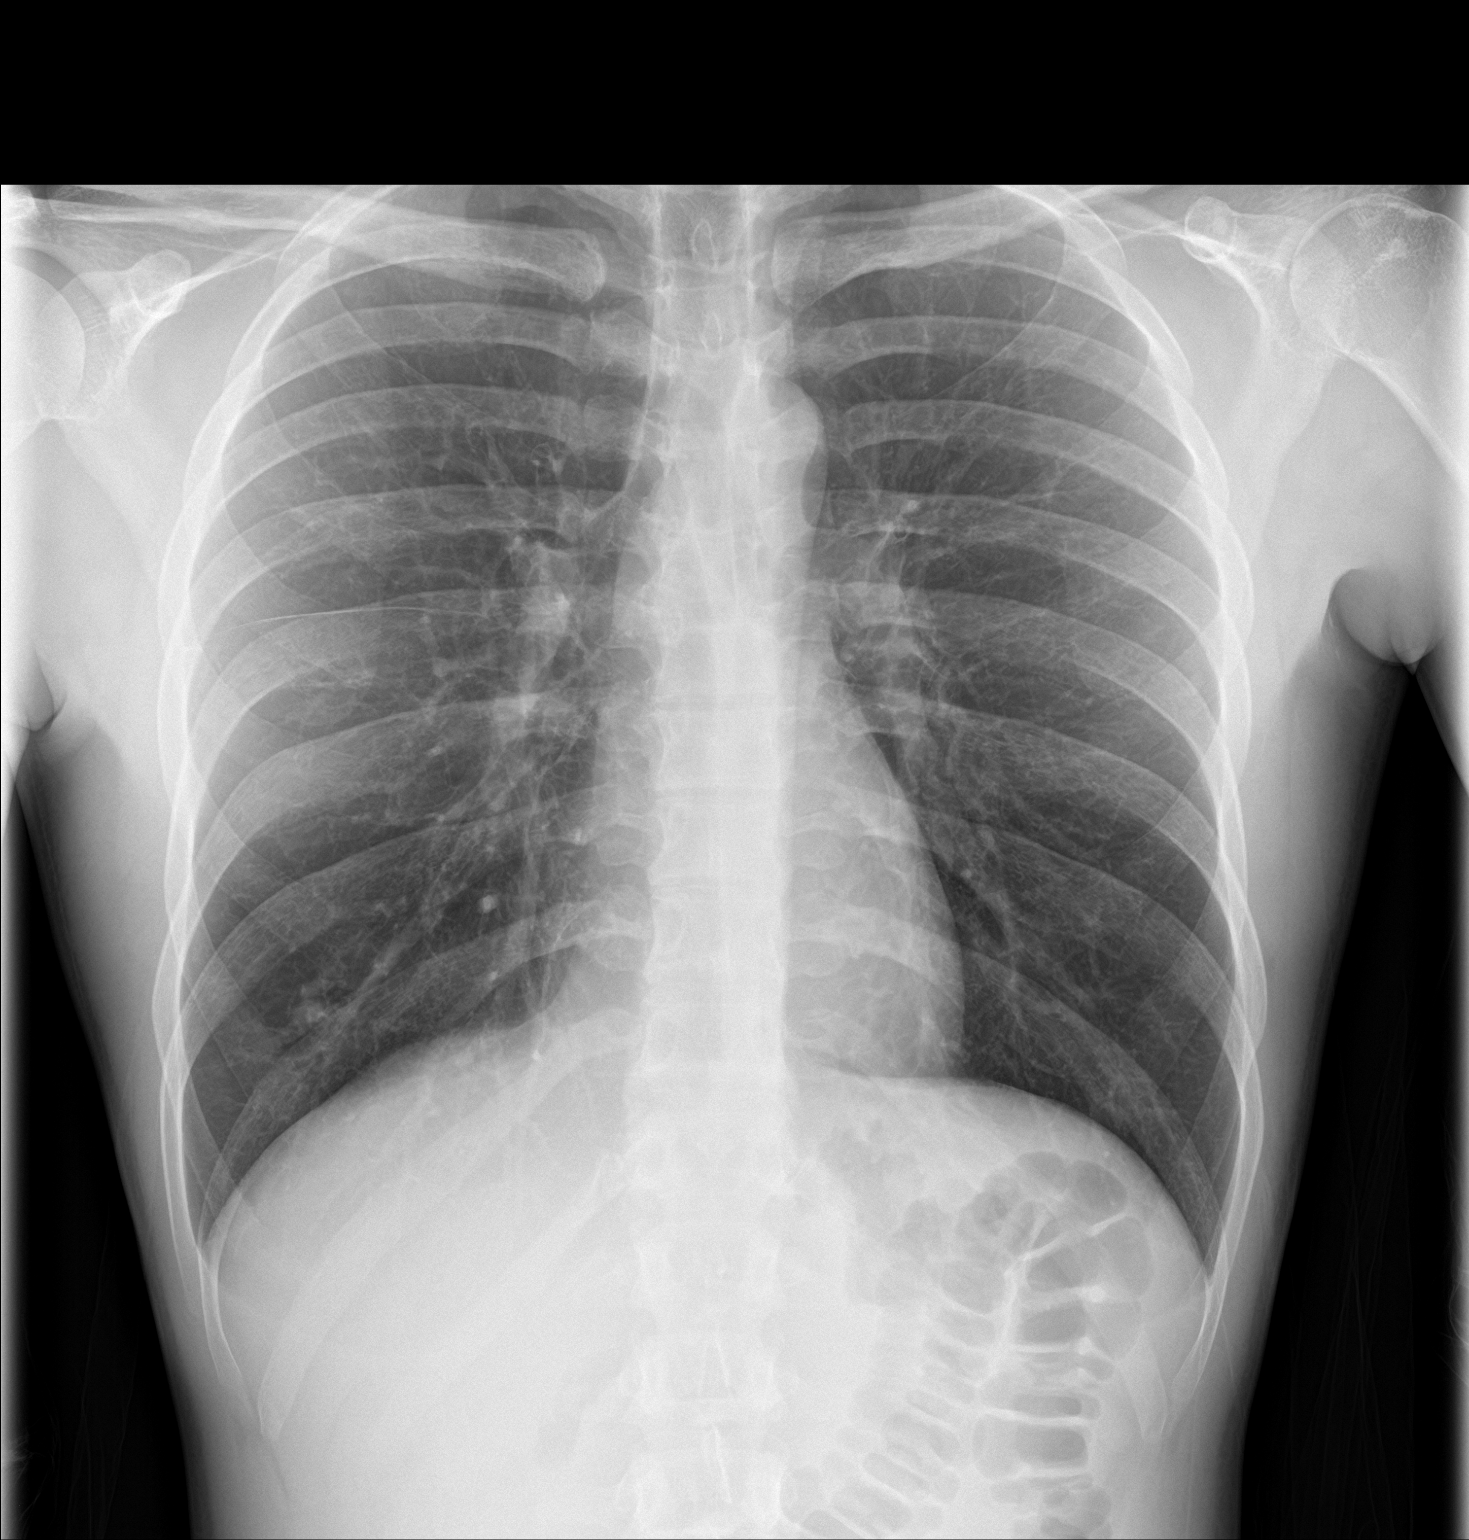

[chest lat]
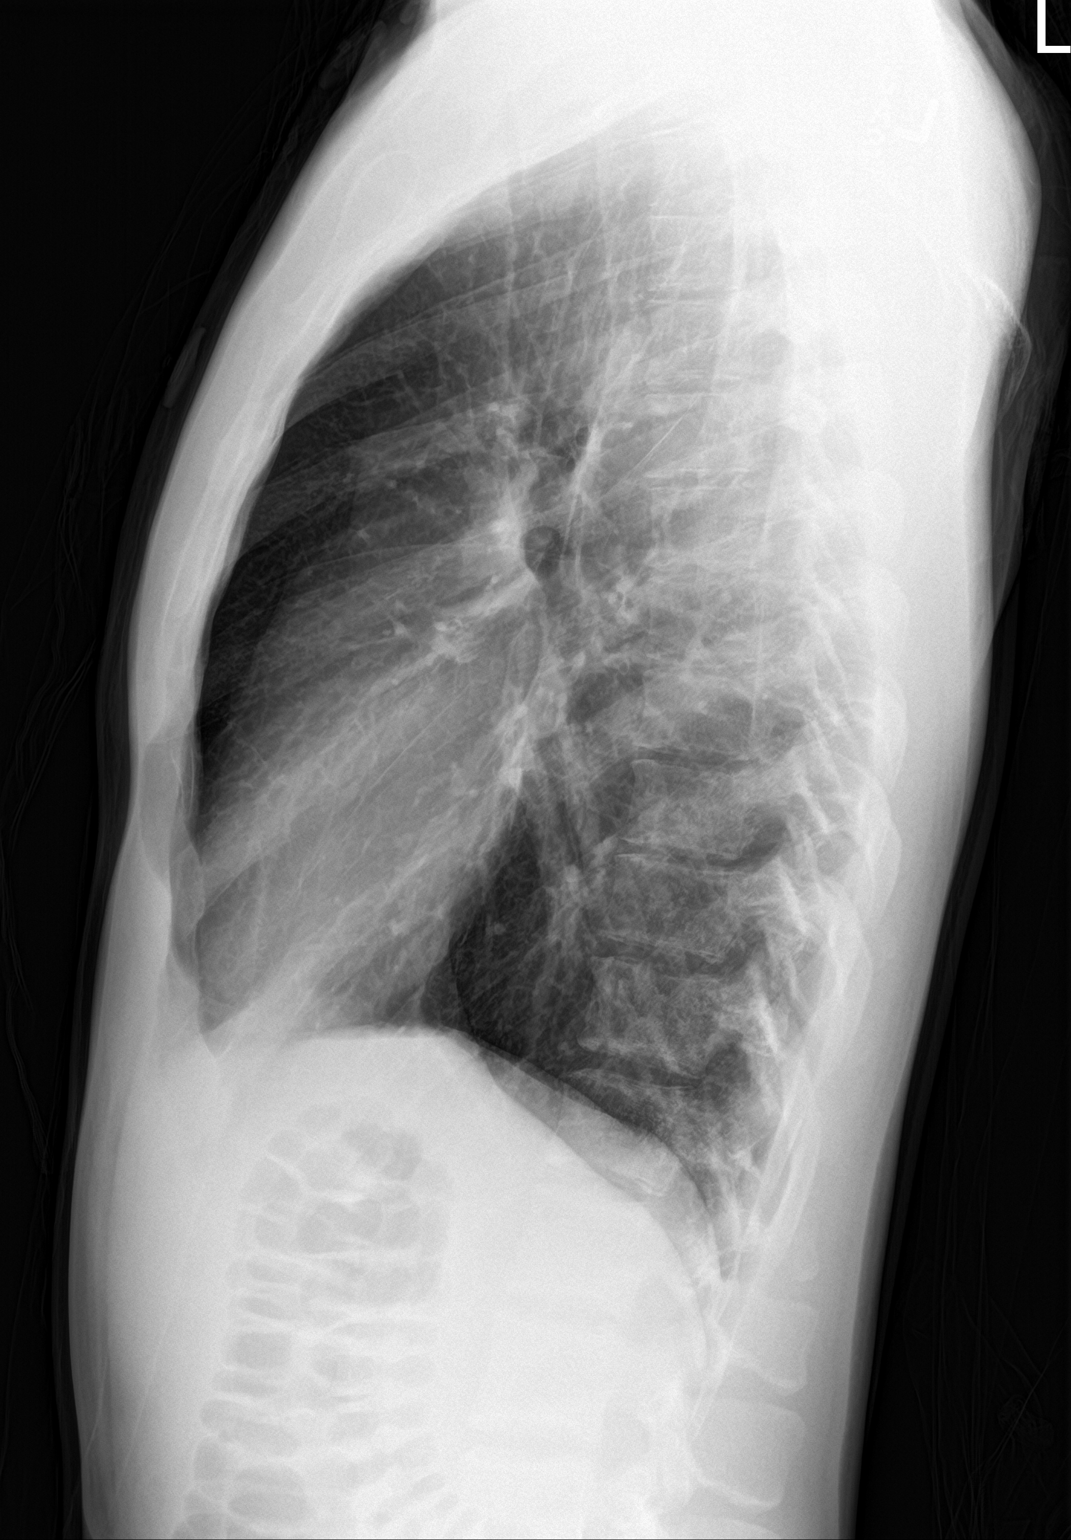

[2 of 2 positions shown; findings below may reference images not displayed]

FINDINGS: The lungs are well-aerated and clear. There is no evidence of focal
opacification, pleural effusion or pneumothorax.

The heart is normal in size; the mediastinal contour is within
normal limits. No acute osseous abnormalities are seen.
IMPRESSION: No acute cardiopulmonary process seen.
